# Patient Record
Sex: Male | Born: 1945 | Race: White | Hispanic: No | Marital: Single | Smoking: Never smoker
Health system: Southern US, Community
[De-identification: ages and names within clinical notes are randomized; demographics above are authoritative.]

## PROBLEM LIST (undated history)

## (undated) DIAGNOSIS — E119 Type 2 diabetes mellitus without complications: Secondary | ICD-10-CM

## (undated) DIAGNOSIS — E785 Hyperlipidemia, unspecified: Secondary | ICD-10-CM

## (undated) DIAGNOSIS — I10 Essential (primary) hypertension: Secondary | ICD-10-CM

## (undated) HISTORY — PX: BACK SURGERY: SHX140

---

## 2003-03-29 ENCOUNTER — Ambulatory Visit (HOSPITAL_COMMUNITY): Admission: RE | Admit: 2003-03-29 | Discharge: 2003-03-29 | Payer: Self-pay | Admitting: Gastroenterology

## 2010-01-09 ENCOUNTER — Encounter: Admission: RE | Admit: 2010-01-09 | Discharge: 2010-02-14 | Payer: Self-pay | Admitting: Orthopedic Surgery

## 2010-12-01 NOTE — Op Note (Signed)
   NAME:  Dakota Barnes, Dakota Barnes NO.:  [id number]   MEDICAL RECORD NO.:  [id number]                   PATIENT TYPE:  AMB   LOCATION:  ENDO                                 FACILITY:  [facility]   PHYSICIAN:  Anselmo Rod, M.D.               DATE OF BIRTH:  [birth year]   DATE OF PROCEDURE:  03/29/2003  DATE OF DISCHARGE:                                 OPERATIVE REPORT   PROCEDURE PERFORMED:  Colonoscopy.   ENDOSCOPIST:  Charna Elizabeth, M.D.   INSTRUMENT USED:  Olympus video colonoscope.   INDICATIONS FOR PROCEDURE:  Rectal bleeding in a 65 year old white male.  Rule out colonic polyps, masses, etc.   PREPROCEDURE PREPARATION:  Informed consent was procured from the patient.  The patient was fasted for eight hours prior to the procedure and prepped  with a bottle of magnesium citrate and a gallon of GoLYTELY the night prior  to the procedure.   PREPROCEDURE PHYSICAL:  The patient had stable vital signs.  Neck supple.  Chest clear to auscultation.  S1 and S2 regular.  Abdomen soft with normal  bowel sounds.   DESCRIPTION OF PROCEDURE:  The patient was placed in left lateral decubitus  position and sedated with 70 mg of Demerol and 7 mg of Versed intravenously.  Once the patient was adequately sedated and maintained on low flow oxygen  and continuous cardiac monitoring, the Olympus video colonoscope was  advanced from the rectum to the cecum.  The appendicular orifice and  ileocecal valve were clearly visualized and photographed.  Terminal ileum  appeared normal.  Prominent internal hemorrhoids were seen on retroflexion.  There was evidence of left-sided diverticulosis. There were a few  diverticula with inspissated stool in them.  No masses or polyps were seen.   IMPRESSION:  1. Sigmoid diverticulosis.  2. Prominent internal hemorrhoids.  3. Normal-appearing transverse colon, right colon, cecum and terminal ileum.   RECOMMENDATIONS:  1. A high fiber diet  with liberal fluid intake has been advocated.  2. Repeat colorectal cancer screening is recommended in the next 10 years     unless the patient develops any abnormal symptoms in the interim.  3. Outpatient follow-up as need arises in the future.                                                   Anselmo Rod, M.D.    JNM/MEDQ  D:  03/29/2003  T:  03/29/2003  Job:  [id number]   cc:   Gabriel Earing, M.D.  [address]  [place]  [place] [address]  Fax: [phone]

## 2012-11-10 ENCOUNTER — Encounter: Payer: Self-pay | Admitting: *Deleted

## 2012-11-10 ENCOUNTER — Encounter: Payer: Medicare Other | Attending: Family Medicine | Admitting: *Deleted

## 2012-11-10 DIAGNOSIS — R634 Abnormal weight loss: Secondary | ICD-10-CM | POA: Insufficient documentation

## 2012-11-10 DIAGNOSIS — Z713 Dietary counseling and surveillance: Secondary | ICD-10-CM | POA: Insufficient documentation

## 2012-11-10 DIAGNOSIS — E119 Type 2 diabetes mellitus without complications: Secondary | ICD-10-CM | POA: Insufficient documentation

## 2012-11-10 NOTE — Progress Notes (Signed)
  Medical Nutrition Therapy:  Appt start time: 1045 end time:  1145.  Assessment:  Primary concerns today: patient newly diagnosed with diabetes. Lives with wife and grown daughter. Both wife and pt shop for food and prepare meals eaten at home. He travels with work and has an apartment in [place] where he cooks, several days a week. Works as Production designer, theatre/television/film with [employer]. No SMBG yet. Activity level is limited, likes to walk or bike ride but had busy winter with work. He has started walking during his lunch for 3-45 minutes lately. States weight loss of 6 pounds in past month.  MEDICATIONS: see list, Diabetes medication in Metformin   DIETARY INTAKE: Usual eating pattern includes 2-3 meals and 2-3 snacks per day.  Everyday foods include good variety of all food groups.  Avoided foods include chips, sweets usually, soft drinks, .    24-hr recall:  B ( AM): cereal with 1-2% milk OR [employer] muffin with sausage and egg, black coffee Snk ( AM): small oranges during the day  L ( PM): skips occasionally, sandwich with fresh fruit,  Snk ( PM): if anything, fresh fruit OR popcorn D ( PM): grilled meat with salad OR tenderloin with vegetable and or salad, OR sandwich, milk Snk ( PM): fresh fruit OR Activia yogurt Beverages: coffee, milk, water or occasionally 1/2 of a Diet Coke  Usual physical activity: walking during lunch break at work now. Active on weekends with horses and yard work.  Estimated energy needs: 1600 calories 180 g carbohydrates 120 g protein 44 g fat  Progress Towards Goal(s):  In progress.   Nutritional Diagnosis:  NB-1.1 Food and nutrition-related knowledge deficit As related to new diagnosis of diabetes.  As evidenced by A1c of 7.3% on 10/10/2012.    Intervention:  Nutrition counseling and diabetes education initiated. Discussed basic physiology of diabetes, SMBG and rationale of checking BG at alternate times of day, A1c, Carb Counting and reading food  labels, and benefits of increased activity. He feels he would benefit from obtaining a meter so he can be more aware of his diabetes management. . Plan:  Aim for 3 Carb Choices per meal (45 grams) +/- 1 either way  Aim for 0-2 Carbs per snack if hungry  Consider reading food labels for Total Carbohydrate of foods Continue with your activity level of walking and other activities daily as tolerated Consider checking BG at alternate times per day as directed by MD   Handouts given during visit include: Living Well with Diabetes Carb Counting and Food Label handouts Meal Plan Card  Monitoring/Evaluation:  Dietary intake, exercise, reading food labels, and body weight prn.

## 2012-11-10 NOTE — Patient Instructions (Signed)
Plan:  Aim for 3 Carb Choices per meal (45 grams) +/- 1 either way  Aim for 0-2 Carbs per snack if hungry  Consider reading food labels for Total Carbohydrate of foods Continue with your activity level of walking and other activities daily as tolerated Consider checking BG at alternate times per day as directed by MD

## 2017-09-09 ENCOUNTER — Ambulatory Visit (INDEPENDENT_AMBULATORY_CARE_PROVIDER_SITE_OTHER): Payer: Self-pay | Admitting: Orthopaedic Surgery

## 2017-09-23 ENCOUNTER — Encounter (INDEPENDENT_AMBULATORY_CARE_PROVIDER_SITE_OTHER): Payer: Self-pay | Admitting: Orthopaedic Surgery

## 2017-09-23 ENCOUNTER — Ambulatory Visit (INDEPENDENT_AMBULATORY_CARE_PROVIDER_SITE_OTHER): Payer: Medicare HMO

## 2017-09-23 ENCOUNTER — Ambulatory Visit (INDEPENDENT_AMBULATORY_CARE_PROVIDER_SITE_OTHER): Payer: Medicare HMO | Admitting: Orthopaedic Surgery

## 2017-09-23 DIAGNOSIS — M1711 Unilateral primary osteoarthritis, right knee: Secondary | ICD-10-CM

## 2017-09-23 DIAGNOSIS — G8929 Other chronic pain: Secondary | ICD-10-CM

## 2017-09-23 DIAGNOSIS — M25561 Pain in right knee: Secondary | ICD-10-CM | POA: Diagnosis not present

## 2017-09-23 MED ORDER — LIDOCAINE HCL 1 % IJ SOLN
3.0000 mL | INTRAMUSCULAR | Status: AC | PRN
  Administered 2017-09-23: 3 mL

## 2017-09-23 MED ORDER — METHYLPREDNISOLONE ACETATE 40 MG/ML IJ SUSP
40.0000 mg | INTRAMUSCULAR | Status: AC | PRN
  Administered 2017-09-23: 40 mg via INTRA_ARTICULAR

## 2017-09-23 NOTE — Progress Notes (Signed)
Office Visit Note   Patient: Dakota Barnes           Date of Birth: Nov 14, 1945           MRN: [id number] Visit Date: 09/23/2017              Requested by: Angelica Chessman, MD [address] [place], [place] [address] PCP: Angelica Chessman, MD   Assessment & Plan: Visit Diagnoses:  1. Chronic pain of right knee   2. Unilateral primary osteoarthritis, right knee     Plan: I was able to aspirate 40 cc of fluid from his knee consistent with arthritic type of fluid.  I placed a steroid injection in his knee and he tolerated this well.  I do feel this is the best course of treatment for him as well as quad strengthening exercises.  I will do feel that he is a candidate for hyaluronic acid given the moderate arthritis we are seeing.  He is agreeable to this as well.  We will see him back in 4 weeks to place hyaluronic acid into his right knee.  All questions concerns were answered and addressed.  Follow-Up Instructions: Return in about 4 weeks (around 10/21/2017).   Orders:  Orders Placed This Encounter  Procedures  . Large Joint Inj  . XR Knee 1-2 Views Right   No orders of the defined types were placed in this encounter.     Procedures: Large Joint Inj: R knee on 09/23/2017 10:20 AM Indications: diagnostic evaluation and pain Details: 22 G 1.5 in needle, superolateral approach  Arthrogram: No  Medications: 3 mL lidocaine 1 %; 40 mg methylPREDNISolone acetate 40 MG/ML Outcome: tolerated well, no immediate complications Procedure, treatment alternatives, risks and benefits explained, specific risks discussed. Consent was given by the patient. Immediately prior to procedure a time out was called to verify the correct patient, procedure, equipment, support staff and site/side marked as required. Patient was prepped and draped in the usual sterile fashion.       Clinical Data: No additional findings.   Subjective: Chief Complaint  Patient presents with  .  Right Knee - Pain  Patient is a very pleasant 72 year old I am seeing for the first time as a patient I have taken care of his wife before.  He comes in with chief complaint of right knee pain is been slowly getting worse with time.  It does wake him up at night on occasion.  Sometimes it does act as if his knee is going to give out on him.  He denies any specific injury to that knee.  He denies any locking catching.  HPI  Review of Systems He currently denies any headache, chest pain, shortness of breath, fever, chills, nausea, vomiting.  Objective: Vital Signs: There were no vitals taken for this visit.  Physical Exam He he is alert and oriented x3 and in no acute distress Ortho Exam Examination of the right knee does show mild to moderate effusion.  Is full range of motion of the knee with some slight patellofemoral crepitation.  He is a little bit of medial lateral joint line tenderness.  The knee feels ligamentously stable. Specialty Comments:  No specialty comments available.  Imaging: Xr Knee 1-2 Views Right  Result Date: 09/23/2017 2 views of the right knee show moderate arthritic changes with slight joint space narrowing and tricompartmental periarticular osteophytes in all 3 compartments.    PMFS History: Patient Active Problem List  Diagnosis Date Noted  . Unilateral primary osteoarthritis, right knee 09/23/2017  . Chronic pain of right knee 09/23/2017   History reviewed. No pertinent past medical history.  History reviewed. No pertinent family history.  History reviewed. No pertinent surgical history. Social History   Occupational History  . Not on file  Tobacco Use  . Smoking status: Never Smoker  . Smokeless tobacco: Never Used  Substance and Sexual Activity  . Alcohol use: Yes    Comment: beer 1-2 on weekends and after work  . Drug use: Not on file  . Sexual activity: Not on file

## 2017-09-24 ENCOUNTER — Telehealth (INDEPENDENT_AMBULATORY_CARE_PROVIDER_SITE_OTHER): Payer: Self-pay

## 2017-09-24 NOTE — Telephone Encounter (Signed)
Submitted application online for Monovisc, right knee. 

## 2017-10-11 ENCOUNTER — Telehealth (INDEPENDENT_AMBULATORY_CARE_PROVIDER_SITE_OTHER): Payer: Self-pay

## 2017-10-11 NOTE — Telephone Encounter (Signed)
Initiated PA for Monovisc, right knee.  Will receive a fax from [employer]. Reference# [id number]

## 2017-10-17 ENCOUNTER — Telehealth (INDEPENDENT_AMBULATORY_CARE_PROVIDER_SITE_OTHER): Payer: Self-pay

## 2017-10-17 NOTE — Telephone Encounter (Signed)
Faxed form and additional information to [employer] at [phone].

## 2017-10-18 ENCOUNTER — Telehealth (INDEPENDENT_AMBULATORY_CARE_PROVIDER_SITE_OTHER): Payer: Self-pay

## 2017-10-18 NOTE — Telephone Encounter (Signed)
Faxed completed authorization form to [employer] at [phone].

## 2017-10-28 ENCOUNTER — Encounter (INDEPENDENT_AMBULATORY_CARE_PROVIDER_SITE_OTHER): Payer: Self-pay | Admitting: Orthopaedic Surgery

## 2017-10-28 ENCOUNTER — Ambulatory Visit (INDEPENDENT_AMBULATORY_CARE_PROVIDER_SITE_OTHER): Payer: Medicare HMO | Admitting: Orthopaedic Surgery

## 2017-10-28 DIAGNOSIS — M1711 Unilateral primary osteoarthritis, right knee: Secondary | ICD-10-CM

## 2017-10-28 MED ORDER — HYALURONAN 88 MG/4ML IX SOSY
88.0000 mg | PREFILLED_SYRINGE | INTRA_ARTICULAR | Status: AC | PRN
  Administered 2017-10-28: 88 mg via INTRA_ARTICULAR

## 2017-10-28 NOTE — Progress Notes (Signed)
   Procedure Note  Patient: Dakota Barnes             Date of Birth: 01/02/1946           MRN: [id number]             Visit Date: 10/28/2017  Procedures: Visit Diagnoses: Unilateral primary osteoarthritis, right knee  Large Joint Inj: R knee on 10/28/2017 9:41 AM Indications: pain and diagnostic evaluation Details: 22 G 1.5 in needle, superolateral approach  Arthrogram: No  Medications: 88 mg Hyaluronan 88 MG/4ML Outcome: tolerated well, no immediate complications Procedure, treatment alternatives, risks and benefits explained, specific risks discussed. Consent was given by the patient. Immediately prior to procedure a time out was called to verify the correct patient, procedure, equipment, support staff and site/side marked as required. Patient was prepped and draped in the usual sterile fashion.     The patient is here for scheduled hyaluronic acid injection in his right knee today with Monovisc.  This is to treat moderate osteoarthritis pain of his right knee.  We have aspirated his knee before and place a steroid and that helped temporize things with his knee.  He said no other acute changes in his medical status.  On exam of his right knee today there is no effusion.  He does have some medial lateral joint line tenderness and patellofemoral crepitation but it is all manageable.  I provided Monovisc injection in his right knee without difficulty.  All questions concerns were answered and addressed.  He will follow-up as needed.

## 2018-06-23 ENCOUNTER — Emergency Department (HOSPITAL_BASED_OUTPATIENT_CLINIC_OR_DEPARTMENT_OTHER)
Admission: EM | Admit: 2018-06-23 | Discharge: 2018-06-23 | Disposition: A | Discharge status: Home / Self Care | Payer: Medicare HMO | Attending: Emergency Medicine | Admitting: Emergency Medicine

## 2018-06-23 ENCOUNTER — Encounter (HOSPITAL_BASED_OUTPATIENT_CLINIC_OR_DEPARTMENT_OTHER): Payer: Self-pay | Admitting: *Deleted

## 2018-06-23 ENCOUNTER — Other Ambulatory Visit: Payer: Self-pay

## 2018-06-23 ENCOUNTER — Emergency Department (HOSPITAL_BASED_OUTPATIENT_CLINIC_OR_DEPARTMENT_OTHER): Payer: Medicare HMO

## 2018-06-23 DIAGNOSIS — Z79899 Other long term (current) drug therapy: Secondary | ICD-10-CM | POA: Insufficient documentation

## 2018-06-23 DIAGNOSIS — E119 Type 2 diabetes mellitus without complications: Secondary | ICD-10-CM | POA: Diagnosis not present

## 2018-06-23 DIAGNOSIS — I10 Essential (primary) hypertension: Secondary | ICD-10-CM | POA: Diagnosis not present

## 2018-06-23 DIAGNOSIS — Z7984 Long term (current) use of oral hypoglycemic drugs: Secondary | ICD-10-CM | POA: Diagnosis not present

## 2018-06-23 DIAGNOSIS — N200 Calculus of kidney: Secondary | ICD-10-CM | POA: Insufficient documentation

## 2018-06-23 DIAGNOSIS — R1032 Left lower quadrant pain: Secondary | ICD-10-CM | POA: Diagnosis present

## 2018-06-23 HISTORY — DX: Essential (primary) hypertension: I10

## 2018-06-23 HISTORY — DX: Type 2 diabetes mellitus without complications: E11.9

## 2018-06-23 HISTORY — DX: Hyperlipidemia, unspecified: E78.5

## 2018-06-23 LAB — CBC WITH DIFFERENTIAL/PLATELET
Abs Immature Granulocytes: 0.06 10*3/uL (ref 0.00–0.07)
Basophils Absolute: 0 10*3/uL (ref 0.0–0.1)
Basophils Relative: 0 %
EOS PCT: 0 %
Eosinophils Absolute: 0 10*3/uL (ref 0.0–0.5)
HEMATOCRIT: 44.8 % (ref 39.0–52.0)
Hemoglobin: 14.3 g/dL (ref 13.0–17.0)
Immature Granulocytes: 0 %
Lymphocytes Relative: 9 %
Lymphs Abs: 1.3 10*3/uL (ref 0.7–4.0)
MCH: 29.5 pg (ref 26.0–34.0)
MCHC: 31.9 g/dL (ref 30.0–36.0)
MCV: 92.4 fL (ref 80.0–100.0)
MONO ABS: 0.7 10*3/uL (ref 0.1–1.0)
Monocytes Relative: 5 %
Neutro Abs: 11.5 10*3/uL — ABNORMAL HIGH (ref 1.7–7.7)
Neutrophils Relative %: 86 %
Platelets: 277 10*3/uL (ref 150–400)
RBC: 4.85 MIL/uL (ref 4.22–5.81)
RDW: 12.8 % (ref 11.5–15.5)
WBC: 13.6 10*3/uL — ABNORMAL HIGH (ref 4.0–10.5)
nRBC: 0 % (ref 0.0–0.2)

## 2018-06-23 LAB — COMPREHENSIVE METABOLIC PANEL
ALT: 22 U/L (ref 0–44)
AST: 24 U/L (ref 15–41)
Albumin: 4.4 g/dL (ref 3.5–5.0)
Alkaline Phosphatase: 47 U/L (ref 38–126)
Anion gap: 8 (ref 5–15)
BUN: 20 mg/dL (ref 8–23)
CO2: 25 mmol/L (ref 22–32)
Calcium: 9.6 mg/dL (ref 8.9–10.3)
Chloride: 98 mmol/L (ref 98–111)
Creatinine, Ser: 1.24 mg/dL (ref 0.61–1.24)
GFR calc non Af Amer: 58 mL/min — ABNORMAL LOW (ref >60)
Glucose, Bld: 161 mg/dL — ABNORMAL HIGH (ref 70–99)
Potassium: 4.5 mmol/L (ref 3.5–5.1)
Sodium: 131 mmol/L — ABNORMAL LOW (ref 135–145)
Total Bilirubin: 0.7 mg/dL (ref 0.3–1.2)
Total Protein: 7.2 g/dL (ref 6.5–8.1)

## 2018-06-23 LAB — URINALYSIS, MICROSCOPIC (REFLEX)

## 2018-06-23 LAB — URINALYSIS, ROUTINE W REFLEX MICROSCOPIC
BILIRUBIN URINE: NEGATIVE
Glucose, UA: NEGATIVE mg/dL
Ketones, ur: NEGATIVE mg/dL
Leukocytes, UA: NEGATIVE
Nitrite: NEGATIVE
Protein, ur: NEGATIVE mg/dL
Specific Gravity, Urine: 1.025 (ref 1.005–1.030)
pH: 6 (ref 5.0–8.0)

## 2018-06-23 MED ORDER — HYDROMORPHONE HCL 1 MG/ML IJ SOLN
1.0000 mg | Freq: Once | INTRAMUSCULAR | Status: AC
  Administered 2018-06-23: 1 mg via INTRAVENOUS
  Filled 2018-06-23: qty 1

## 2018-06-23 MED ORDER — TAMSULOSIN HCL 0.4 MG PO CAPS: 0.4000 mg | ORAL_CAPSULE | Freq: Every day | ORAL | 0 refills | Status: AC

## 2018-06-23 MED ORDER — OXYCODONE-ACETAMINOPHEN 5-325 MG PO TABS
1.0000 | ORAL_TABLET | Freq: Once | ORAL | Status: AC
  Administered 2018-06-23: 1 via ORAL
  Filled 2018-06-23: qty 1

## 2018-06-23 MED ORDER — ONDANSETRON HCL 4 MG/2ML IJ SOLN
4.0000 mg | Freq: Once | INTRAMUSCULAR | Status: AC
  Administered 2018-06-23: 4 mg via INTRAVENOUS
  Filled 2018-06-23: qty 2

## 2018-06-23 MED ORDER — OXYCODONE-ACETAMINOPHEN 5-325 MG PO TABS: 1.0000 | ORAL_TABLET | Freq: Four times a day (QID) | ORAL | 0 refills | Status: DC | PRN

## 2018-06-23 MED ORDER — TAMSULOSIN HCL 0.4 MG PO CAPS: 0.4000 mg | ORAL_CAPSULE | Freq: Every day | ORAL | 0 refills | Status: DC

## 2018-06-23 NOTE — ED Notes (Signed)
Patient transported to CT 

## 2018-06-23 NOTE — ED Provider Notes (Signed)
[facility] EMERGENCY DEPARTMENT Provider Note   CSN: [id number] Arrival date & time: 06/23/18  1307     History   Chief Complaint Chief Complaint  Patient presents with  . Abdominal Pain    HPI Dakota Barnes is a 72 y.o. male.  The history is provided by the patient.  Abdominal Pain   This is a new problem. The current episode started yesterday. The problem occurs daily. The problem has been rapidly worsening. The pain is associated with an unknown factor. The pain is located in the LLQ (left  flank). The quality of the pain is aching, colicky, shooting, throbbing and sharp. The pain is at a severity of 9/10. The pain is severe. Associated symptoms include nausea. Pertinent negatives include anorexia, fever, diarrhea, flatus, vomiting, dysuria and frequency. Nothing aggravates the symptoms. Nothing relieves the symptoms. Past medical history comments: hx of DM, HTN.    Past Medical History:  Diagnosis Date  . Diabetes mellitus without complication (HCC)   . Hyperlipidemia   . Hypertension     Patient Active Problem List   Diagnosis Date Noted  . Unilateral primary osteoarthritis, right knee 09/23/2017  . Chronic pain of right knee 09/23/2017    Past Surgical History:  Procedure Laterality Date  . BACK SURGERY          Home Medications    Prior to Admission medications   Medication Sig Start Date End Date Taking? Authorizing Provider  aspirin EC 81 MG tablet Take by mouth. 03/22/15  Yes [provider]  fenofibrate 54 MG tablet Take by mouth. 10/02/16  Yes [provider]  gabapentin (NEURONTIN) 300 MG capsule Take 1 capsule twice daily. 02/11/17  Yes [provider]  losartan (COZAAR) 100 MG tablet Take by mouth. 08/20/16  Yes [provider]  meloxicam (MOBIC) 7.5 MG tablet TAKE 1 TABLET BY MOUTH EVERY DAY WITH FOOD FOR KNEE/HAND PAIN 09/12/16  Yes [provider]  montelukast (SINGULAIR) 10 MG tablet Take by  mouth. 04/09/17  Yes [provider]  simvastatin (ZOCOR) 10 MG tablet TAKE 1 TABLET BY MOUTH ONCE DAILY 10/02/16  Yes [provider]  metFORMIN (GLUCOPHAGE) 500 MG tablet Take 500 mg by mouth 2 (two) times daily with a meal.    [provider]  Multiple Vitamin (MULTI-VITAMINS) TABS Take by mouth.    [provider]  Omega-3 1000 MG CAPS Take by mouth.    [provider]    Family History History reviewed. No pertinent family history.  Social History Social History   Tobacco Use  . Smoking status: Never Smoker  . Smokeless tobacco: Never Used  Substance Use Topics  . Alcohol use: Yes    Comment: beer 1-2 on weekends and after work  . Drug use: Not Currently     Allergies   Patient has no known allergies.   Review of Systems Review of Systems  Constitutional: Negative for fever.  Gastrointestinal: Positive for abdominal pain and nausea. Negative for anorexia, diarrhea, flatus and vomiting.  Genitourinary: Negative for dysuria and frequency.  All other systems reviewed and are negative.    Physical Exam Updated Vital Signs BP (!) 178/86 (BP Location: Right Arm)   Pulse (!) 58   Temp 97.8 F (36.6 C) (Oral)   Resp 18   Ht 5\' 8"  (1.727 m)   Wt 98 kg   SpO2 100%   BMI 32.84 kg/m   Physical Exam  Constitutional: He is oriented to person, place,  and time. He appears well-developed and well-nourished. No distress.  Appears uncomfortable  HENT:  Head: Normocephalic and atraumatic.  Mouth/Throat: Oropharynx is clear and moist.  Eyes: Pupils are equal, round, and reactive to light. Conjunctivae and EOM are normal.  Neck: Normal range of motion. Neck supple.  Cardiovascular: Normal rate, regular rhythm and intact distal pulses.  No murmur heard. Pulmonary/Chest: Effort normal and breath sounds normal. No respiratory distress. He has no wheezes. He has no rales.  Abdominal: Soft. He exhibits no distension. There is tenderness  in the left lower quadrant. There is CVA tenderness. There is no rebound and no guarding.  Musculoskeletal: Normal range of motion. He exhibits no edema or tenderness.  Neurological: He is alert and oriented to person, place, and time.  Skin: Skin is warm and dry. No rash noted. No erythema.  Psychiatric: He has a normal mood and affect. His behavior is normal.  Nursing note and vitals reviewed.    ED Treatments / Results  Labs (all labs ordered are listed, but only abnormal results are displayed) Labs Reviewed  URINALYSIS, ROUTINE W REFLEX MICROSCOPIC - Abnormal; Notable for the following components:      Result Value   Hgb urine dipstick SMALL (*)    All other components within normal limits  CBC WITH DIFFERENTIAL/PLATELET - Abnormal; Notable for the following components:   WBC 13.6 (*)    Neutro Abs 11.5 (*)    All other components within normal limits  COMPREHENSIVE METABOLIC PANEL - Abnormal; Notable for the following components:   Sodium 131 (*)    Glucose, Bld 161 (*)    GFR calc non Af Amer 58 (*)    All other components within normal limits  URINALYSIS, MICROSCOPIC (REFLEX) - Abnormal; Notable for the following components:   Bacteria, UA RARE (*)    All other components within normal limits    EKG None  Radiology Ct Renal Stone Study  Result Date: 06/23/2018 CLINICAL DATA:  Left flank pain and nausea for 2 days EXAM: CT ABDOMEN AND PELVIS WITHOUT CONTRAST TECHNIQUE: Multidetector CT imaging of the abdomen and pelvis was performed following the standard protocol without IV contrast. COMPARISON:  None. FINDINGS: Lower chest: Lung bases are free of acute infiltrate or sizable effusion. Calcified hilar and mediastinal lymph nodes are noted consistent with prior granulomatous disease. Hepatobiliary: No focal liver abnormality is seen. No gallstones, gallbladder wall thickening, or biliary dilatation. Pancreas: Unremarkable. No pancreatic ductal dilatation or surrounding  inflammatory changes. Spleen: Normal in size without focal abnormality. Adrenals/Urinary Tract: Adrenal glands are within normal limits. The right kidney demonstrates no renal calculi or obstructive changes. A small 1 cm cyst is noted in the mid to lower pole. The right ureter is unremarkable. The bladder is well distended. The left kidney demonstrates hydronephrotic change extending into the proximal left ureter secondary to a 9 mm obstructing stone. Perinephric stranding is seen. The more distal left ureter is within normal limits. Scattered cysts are noted within the left kidney is well. The largest of these measures 1.5 cm. Stomach/Bowel: Diverticulosis without evidence of diverticulitis. This is primarily within the sigmoid colon. The appendix is within normal limits. No small bowel or gastric abnormality is seen. Vascular/Lymphatic: Aortic atherosclerosis. No enlarged abdominal or pelvic lymph nodes. Reproductive: Prostate is unremarkable. Other: No abdominal wall hernia or abnormality. No abdominopelvic ascites. Musculoskeletal: Degenerative changes of the lumbar spine are seen. IMPRESSION: 9 mm stone within the proximal left ureter causing obstructive change in perinephric stranding. Diverticulosis  without diverticulitis. Changes of prior granulomatous disease. Bilateral renal cysts. Electronically Signed   By: Alcide Clever M.D.   On: 06/23/2018 17:01    Procedures Procedures (including critical care time)  Medications Ordered in ED Medications  HYDROmorphone (DILAUDID) injection 1 mg (has no administration in time range)  ondansetron (ZOFRAN) injection 4 mg (has no administration in time range)     Initial Impression / Assessment and Plan / ED Course  I have reviewed the triage vital signs and the nursing notes.  Pertinent labs & imaging results that were available during my care of the patient were reviewed by me and considered in my medical decision making (see chart for details).      Pt with symptoms consistent with kidney stone.  Denies infectious sx, or GI symptoms.  Low concern for diverticulitis or history suggestive of AAA.  No hx suggestive of GU source (discharge).  Will treat pain and ensure no infection with UA, CBC, CMP and will get stone study to further eval.  4:51 PM UA with small Hb and remainder of labs wnl.  CT pending.  6:21 PM CT is consistent with a 9 mm stone and hydronephrosis.  After the first dose of pain control with Dilaudid patient had has pain of 2 out of 10.  He was given oral Percocet and the pain has remained controlled.  He was discharged home with Percocet and Flomax.  Given urology follow-up and strict return precautions.   Final Clinical Impressions(s) / ED Diagnoses   Final diagnoses:  Kidney stone on left side    ED Discharge Orders         Ordered    oxyCODONE-acetaminophen (PERCOCET/ROXICET) 5-325 MG tablet  Every 6 hours PRN     06/23/18 1819    tamsulosin (FLOMAX) 0.4 MG CAPS capsule  Daily after supper     06/23/18 1819           Gwyneth Sprout, MD 06/23/18 Rickey Primus

## 2018-06-23 NOTE — ED Notes (Signed)
Pt verbalizes understanding of d/c instructions and denies any further needs at this time. 

## 2018-06-23 NOTE — ED Triage Notes (Signed)
Pt c/o left lower abd pain lower back pain x 2 days

## 2018-06-23 NOTE — ED Notes (Signed)
ED Provider at bedside. 

## 2018-06-23 NOTE — Discharge Instructions (Signed)
Return if you have severe pain that is not controlled with medications, recurrent vomiting or fever.

## 2019-11-24 IMAGING — CT CT RENAL STONE PROTOCOL
2 of 4 series · 16 of 46 positions shown, 18 images · non-contrast
Comparison: None.

CLINICAL DATA: Left flank pain and nausea for 2 days

EXAM:
CT ABDOMEN AND PELVIS WITHOUT CONTRAST
TECHNIQUE: Multidetector CT imaging of the abdomen and pelvis was performed
following the standard protocol without IV contrast.

[Series 2: axial st · axial · 0.90mm/px · z∈[-465,-10]mm · 13 of 101 slices shown, 15 images]
[im 5/101  soft-tissue]
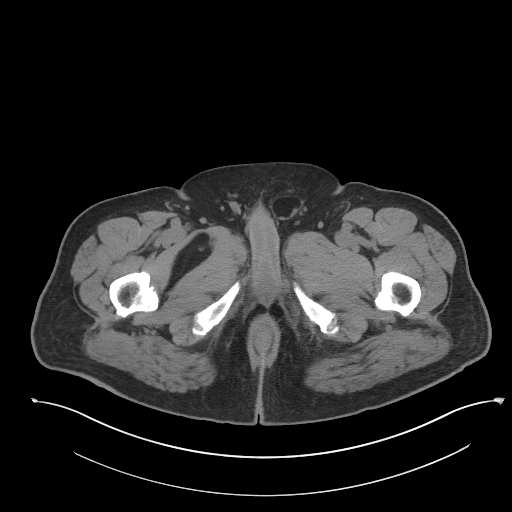
[im 5/101  bone]
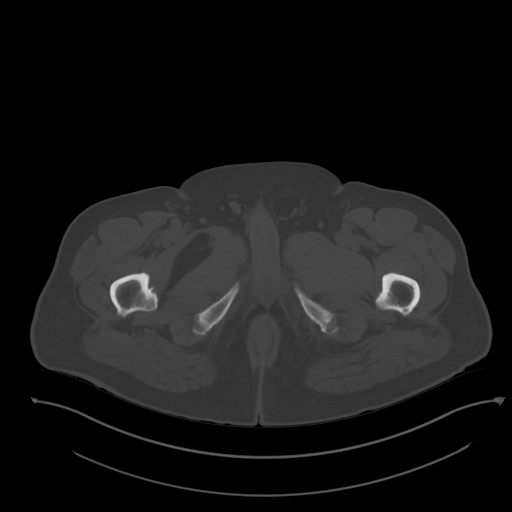
[im 14/101  soft-tissue]
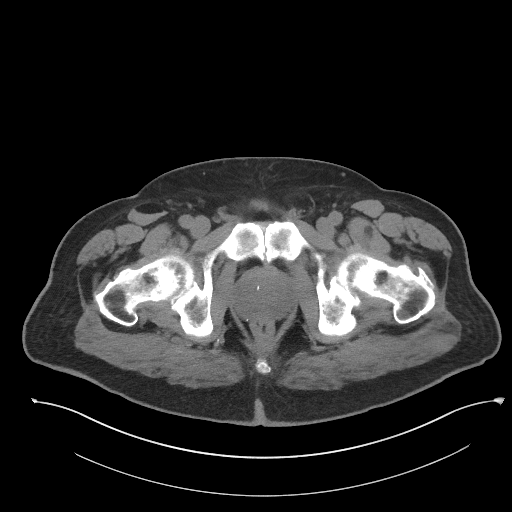
[im 22/101  soft-tissue]
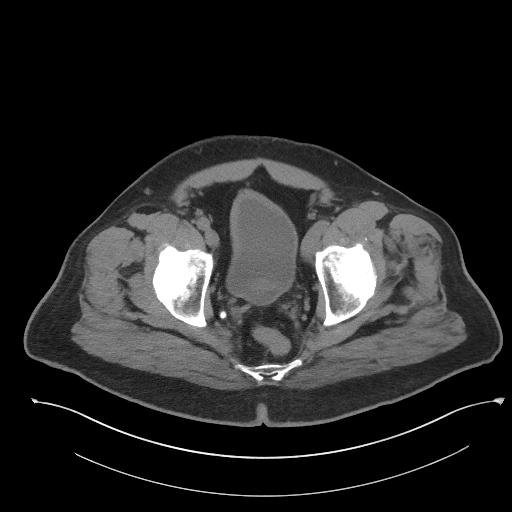
[im 27/101  soft-tissue]
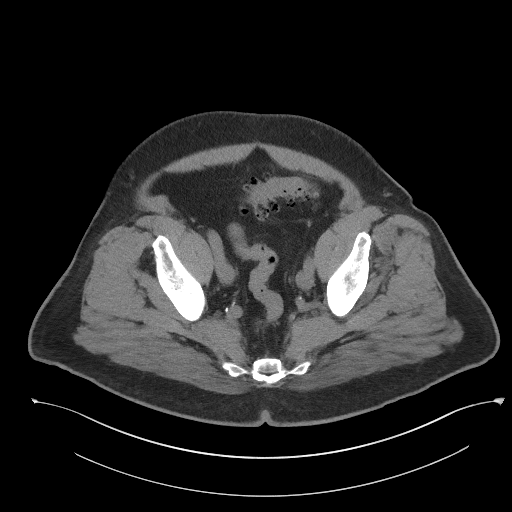
[im 35/101  soft-tissue]
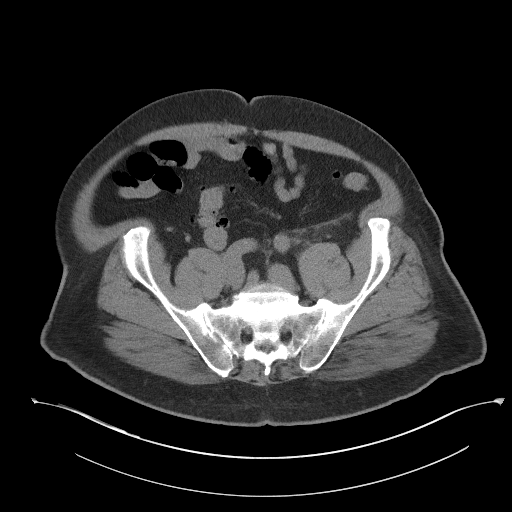
[im 44/101  soft-tissue]
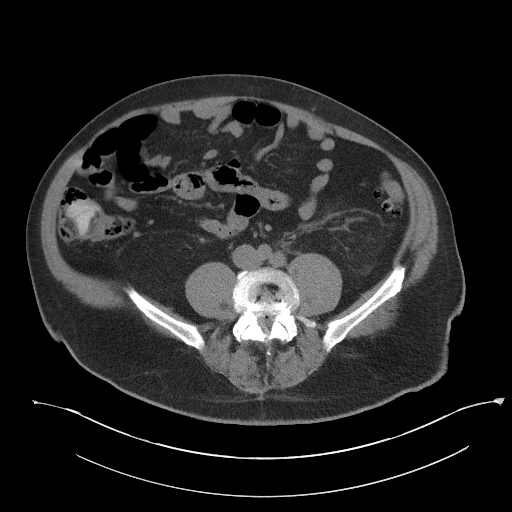
[im 53/101  soft-tissue]
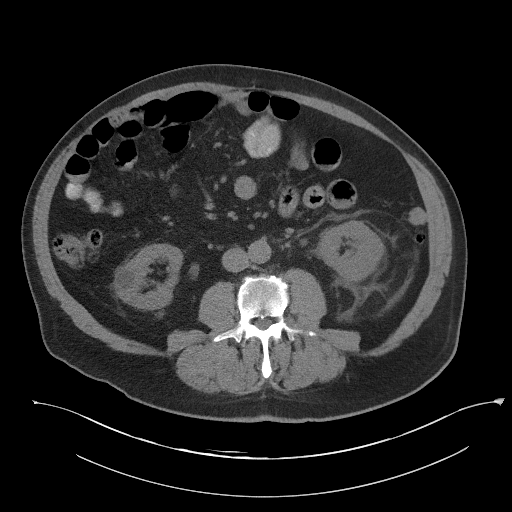
[im 57/101  soft-tissue]
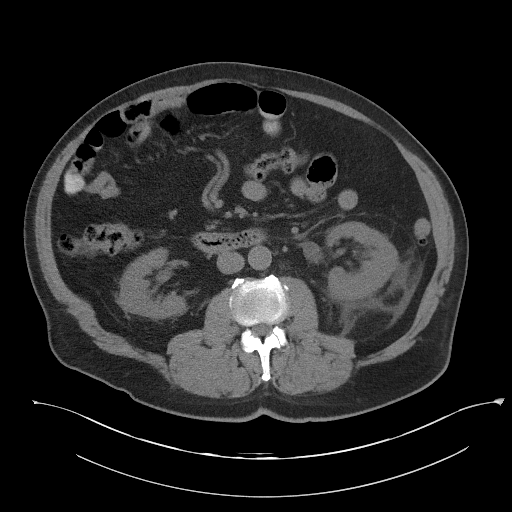
[im 66/101  soft-tissue]
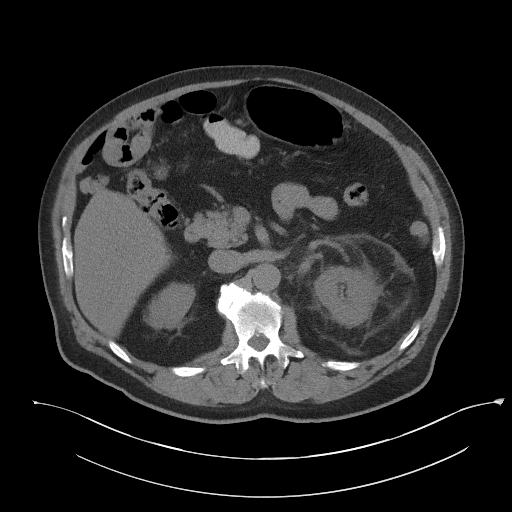
[im 66/101  bone]
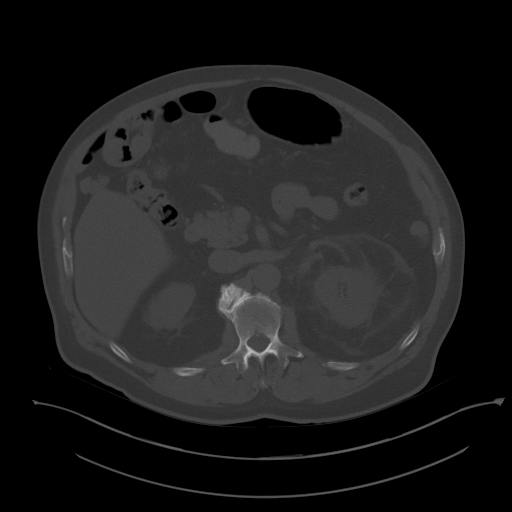
[im 74/101  soft-tissue]
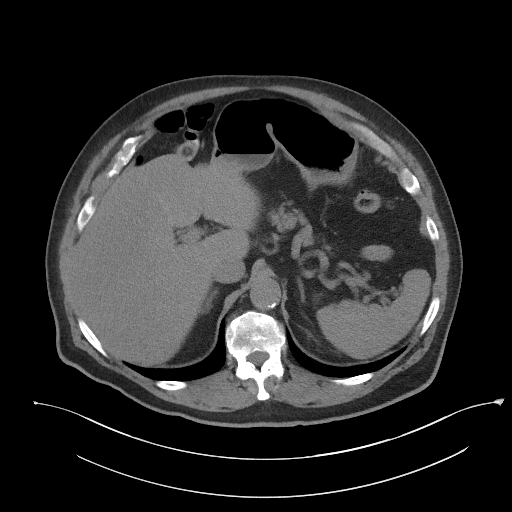
[im 79/101  soft-tissue]
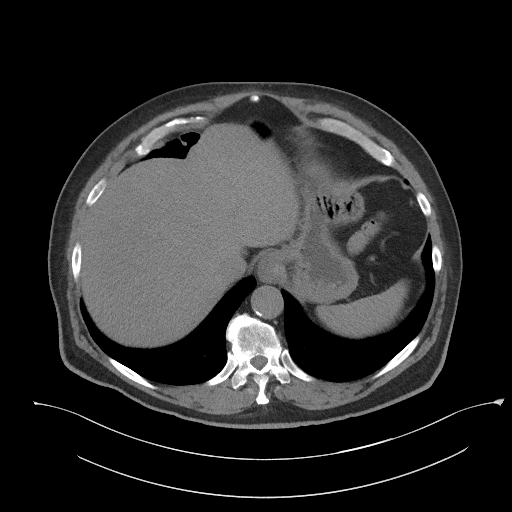
[im 87/101  soft-tissue]
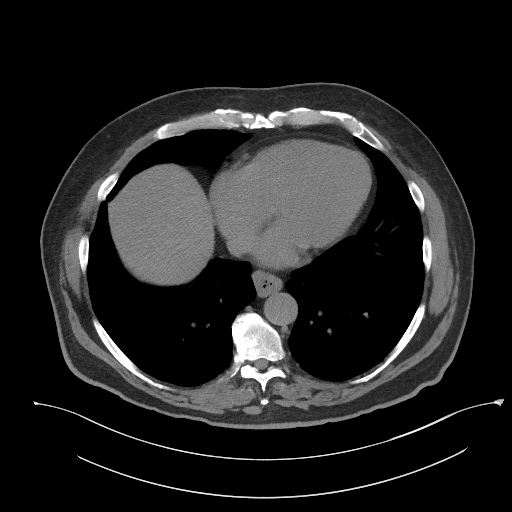
[im 96/101  soft-tissue]
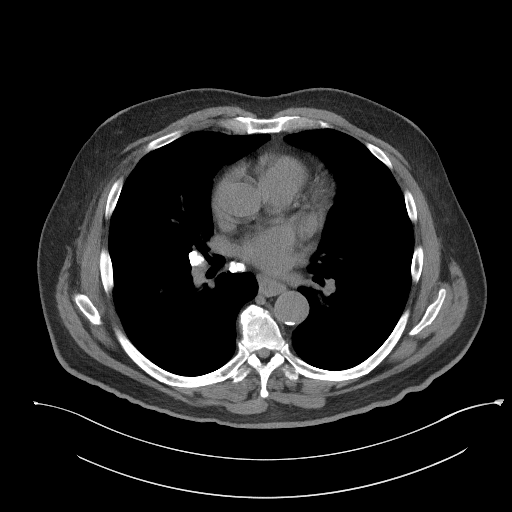

[Series 5: coronal st · coronal · 0.85mm/px · 3 of 101 slices shown]
[im 34/101  soft-tissue]
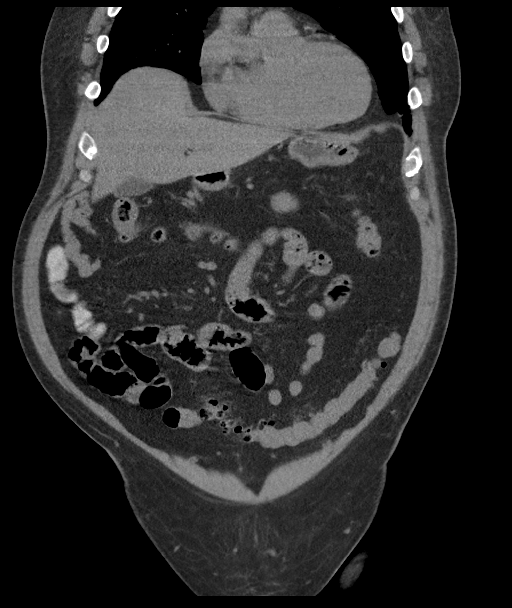
[im 45/101  soft-tissue]
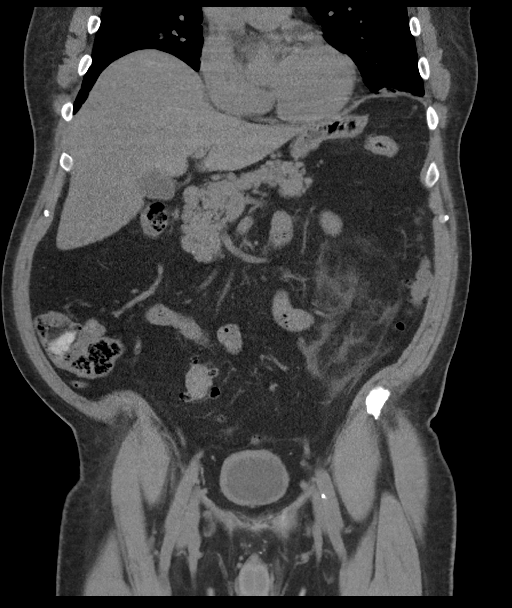
[im 56/101  soft-tissue]
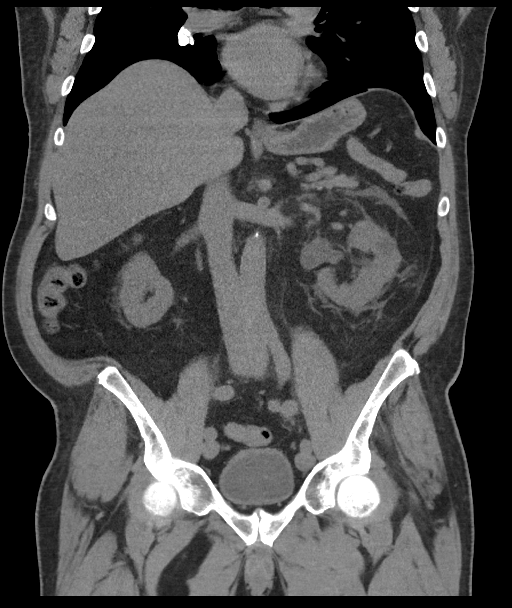

[16 of 46 positions shown; findings below may reference images not displayed]

FINDINGS: Lower chest: Lung bases are free of acute infiltrate or sizable
effusion. Calcified hilar and mediastinal lymph nodes are noted
consistent with prior granulomatous disease.

Hepatobiliary: No focal liver abnormality is seen. No gallstones,
gallbladder wall thickening, or biliary dilatation.

Pancreas: Unremarkable. No pancreatic ductal dilatation or
surrounding inflammatory changes.

Spleen: Normal in size without focal abnormality.

Adrenals/Urinary Tract: Adrenal glands are within normal limits. The
right kidney demonstrates no renal calculi or obstructive changes. A
small 1 cm cyst is noted in the mid to lower pole. The right ureter
is unremarkable. The bladder is well distended. The left kidney
demonstrates hydronephrotic change extending into the proximal left
ureter secondary to a 9 mm obstructing stone. Perinephric stranding
is seen. The more distal left ureter is within normal limits.
Scattered cysts are noted within the left kidney is well. The
largest of these measures 1.5 cm.

Stomach/Bowel: Diverticulosis without evidence of diverticulitis.
This is primarily within the sigmoid colon. The appendix is within
normal limits. No small bowel or gastric abnormality is seen.

Vascular/Lymphatic: Aortic atherosclerosis. No enlarged abdominal or
pelvic lymph nodes.

Reproductive: Prostate is unremarkable.

Other: No abdominal wall hernia or abnormality. No abdominopelvic
ascites.

Musculoskeletal: Degenerative changes of the lumbar spine are seen.
IMPRESSION: 9 mm stone within the proximal left ureter causing obstructive
change in perinephric stranding.

Diverticulosis without diverticulitis.

Changes of prior granulomatous disease.

Bilateral renal cysts.

## 2021-06-14 ENCOUNTER — Encounter: Payer: Self-pay | Admitting: Physician Assistant

## 2021-06-14 ENCOUNTER — Ambulatory Visit (INDEPENDENT_AMBULATORY_CARE_PROVIDER_SITE_OTHER): Payer: Medicare HMO | Admitting: Physician Assistant

## 2021-06-14 ENCOUNTER — Ambulatory Visit (INDEPENDENT_AMBULATORY_CARE_PROVIDER_SITE_OTHER): Payer: Medicare HMO

## 2021-06-14 ENCOUNTER — Other Ambulatory Visit: Payer: Self-pay

## 2021-06-14 DIAGNOSIS — M25561 Pain in right knee: Secondary | ICD-10-CM

## 2021-06-14 DIAGNOSIS — G8929 Other chronic pain: Secondary | ICD-10-CM

## 2021-06-14 DIAGNOSIS — M25511 Pain in right shoulder: Secondary | ICD-10-CM | POA: Diagnosis not present

## 2021-06-14 DIAGNOSIS — R200 Anesthesia of skin: Secondary | ICD-10-CM

## 2021-06-14 MED ORDER — METHYLPREDNISOLONE ACETATE 40 MG/ML IJ SUSP
40.0000 mg | INTRAMUSCULAR | Status: AC | PRN
Start: 2021-06-14 — End: 2021-06-14
  Administered 2021-06-14: 40 mg via INTRA_ARTICULAR

## 2021-06-14 MED ORDER — LIDOCAINE HCL 1 % IJ SOLN
3.0000 mL | INTRAMUSCULAR | Status: AC | PRN
Start: 2021-06-14 — End: 2021-06-14
  Administered 2021-06-14: 3 mL

## 2021-06-14 NOTE — Addendum Note (Signed)
Addended by: Barbette Or on: 06/14/2021 04:55 PM   Modules accepted: Orders

## 2021-06-14 NOTE — Progress Notes (Signed)
Office Visit Note   Patient: Dakota Barnes           Date of Birth: 10-09-1945           MRN: [id number] Visit Date: 06/14/2021              Requested by: Dakota Chessman, MD [address] [place],  [place] [address]+     PCP: Dakota Chessman, MD   Assessment & Plan: Visit Diagnoses:  1. Chronic pain of right knee   2. Bilateral hand numbness   3. Right shoulder pain, unspecified chronicity     Plan: He will watch his glucose levels closely over the next 24 to 48 hours.  We will have him undergo EMG nerve conduction studies upper extremities to rule out carpal tunnel as a source of his numbness in his hands.  In regards to the right shoulder May consider cortisone injection subacromial versus intra-articular at next office visit.  He shown shoulder exercises.  Questions were encouraged and answered at length  Follow-Up Instructions: Return AFTER EMG NCS.   Orders:  Orders Placed This Encounter  Procedures   Large Joint Inj   XR Knee 1-2 Views Right   No orders of the defined types were placed in this encounter.     Procedures: Large Joint Inj: R knee on 06/14/2021 4:39 PM Medications: 3 mL lidocaine 1 %; 40 mg methylPREDNISolone acetate 40 MG/ML Consent was given by the patient. Immediately prior to procedure a time out was called to verify the correct patient, procedure, equipment, support staff and site/side marked as required. Patient was prepped and draped in the usual sterile fashion.      Clinical Data: No additional findings.   Subjective: Chief Complaint  Patient presents with   Right Shoulder - Pain   Right Knee - Pain    HPI Dakota Barnes is a 75 year old male who is known to Dakota Barnes service comes in today with multiple complaints.  Complaining of right knee pain.  He was last seen in April 2019 and was given a Monovisc injection in his knee and did well until recently in regards to the knee.  Does report 2 and half  months ago he twisted and went over getting out of the camper.  He states knee gives way on him now.  States the knee feels stiff. Right shoulder pain states bothers him when driving long distances and it bothers him more at night.  He reports that he popped the tendon to be 8 and half years ago.  Did recently have radiographs of the shoulder on 05/30/2021 [facility] is able to reviewed the read on the radiographs but not the actual images.  That showed some mild glenohumeral joint arthritic changes and arthritic changes AC joint.  Otherwise shoulder well located no bony abnormalities. He also notes that he has numbness tingling both hands states his hands go numb when he is holding any objects like a phone.  Does wear night splints states he has been told that he has carpal tunnel syndrome both hands is unsure if he had nerve conduction studies though.  Is diabetic last hemoglobin A1c was 7.7. Review of Systems Negative for fevers chills.  Please see HPI  Objective: Vital Signs: There were no vitals taken for this visit.  Physical Exam Constitutional:      Appearance: He is not ill-appearing or diaphoretic.  Pulmonary:     Effort: Pulmonary  effort is normal.  Neurological:     Mental Status: He is alert and oriented to person, place, and time.  Psychiatric:        Behavior: Behavior normal.    Ortho Exam Bilateral hands sensation intact throughout.  Positive Tinel's over the median nerve at the wrist bilaterally. Bilateral shoulders 5 out of 5 strength with external and internal rotation against resistance.  Empty can test is negative bilaterally.  Impingement testing is negative right shoulder no significant crepitus.  Biceps muscle belly does not appear low riding. Bilateral knees: Good range of motion.  Right knee no abnormal warmth erythema or effusion.  No instability valgus varus stressing right knee.  Tenderness over the medial joint line right knee.  Specialty Comments:  No  specialty comments available.  Imaging: XR Knee 1-2 Views Right  Result Date: 06/14/2021 Right knee 2 views: Moderate medial lateral and patellofemoral compartmental arthritis.  No acute fractures.  Knee is well located.    PMFS History: Patient Active Problem List   Diagnosis Date Noted   Unilateral primary osteoarthritis, right knee 09/23/2017   Chronic pain of right knee 09/23/2017   Past Medical History:  Diagnosis Date   Diabetes mellitus without complication (HCC)    Hyperlipidemia    Hypertension     History reviewed. No pertinent family history.  Past Surgical History:  Procedure Laterality Date   BACK SURGERY     Social History   Occupational History   Not on file  Tobacco Use   Smoking status: Never   Smokeless tobacco: Never  Vaping Use   Vaping Use: Never used  Substance and Sexual Activity   Alcohol use: Yes    Comment: beer 1-2 on weekends and after work   Drug use: Not Currently   Sexual activity: Not on file

## 2021-06-21 ENCOUNTER — Ambulatory Visit (INDEPENDENT_AMBULATORY_CARE_PROVIDER_SITE_OTHER): Payer: Medicare HMO | Admitting: Physical Medicine and Rehabilitation

## 2021-06-21 ENCOUNTER — Encounter: Payer: Self-pay | Admitting: Physical Medicine and Rehabilitation

## 2021-06-21 ENCOUNTER — Other Ambulatory Visit: Payer: Self-pay

## 2021-06-21 DIAGNOSIS — R202 Paresthesia of skin: Secondary | ICD-10-CM | POA: Diagnosis not present

## 2021-06-21 NOTE — Progress Notes (Signed)
Pt state numbness in both hands. Pt state when sleeping or drinking coffee he notice that his hands goes numb. Pt state he has numbness in all fingers.Pt state he shakes his hands to help his the numbness. Pt state he is right handed.  Numeric Pain Rating Scale and Functional Assessment Average Pain 8   In the last MONTH (on 0-10 scale) has pain interfered with the following?  1. General activity like being  able to carry out your everyday physical activities such as walking, climbing stairs, carrying groceries, or moving a chair?  Rating(10)    -BT, -Dye Allergies.

## 2021-06-26 NOTE — Progress Notes (Signed)
Dakota Barnes - 75 y.o. male MRN [id number]  Date of birth: 16-Nov-1945  Office Visit Note: Visit Date: 06/21/2021 PCP: Angelica Chessman, MD Referred by: Angelica Chessman, MD  Subjective: Chief Complaint  Patient presents with   Left Hand - Numbness   Right Hand - Numbness   HPI:  Dakota Barnes is a 75 y.o. male who comes in today at the request of Rexene Edison, PA-C for electrodiagnostic study of the Bilateral upper extremities.  Patient is Right hand dominant.  His biggest complaint is numbness in both hands.  He reports it is equal both right and left.  He endorses whole hand numbness but somewhat more particularly the radial digits.  He does report more symptoms at night when sleeping or when holding a phone or trying to drink coffee.  He does endorse a positive flick sign.  He has had no prior electrodiagnostic studies that he can remember.  He has a type II diabetic with last hemoglobin A1c in the middle 7 range.  He denies any frank radicular symptoms.  ROS Otherwise per HPI.  Assessment & Plan: Visit Diagnoses:    ICD-10-CM   1. 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   There is no significant electrodiagnostic evidence of any other focal nerve entrapment, brachial plexopathy or cervical radiculopathy.   Recommendations: 1.  Follow-up with referring physician. 2.  Continue current management of symptoms. 3.  Suggest surgical evaluation.  Meds & Orders: No orders of the defined types were placed in this encounter.   Orders Placed This Encounter  Procedures   NCV with EMG (electromyography)    Follow-up: Return in about 2 weeks (around 07/05/2021) for Doneen Poisson, MD.   Procedures: No procedures performed  EMG & NCV  Findings: Evaluation of the left median motor and the right median motor nerves showed prolonged distal onset latency (L5.9, R6.0 ms), reduced amplitude (L2.9, R4.1 mV), and decreased conduction velocity (Elbow-Wrist, L45, R40 m/s).  The left median (across palm) sensory nerve showed no response (Palm) and prolonged distal peak latency (5.6 ms).  The right median (across palm) sensory nerve showed no response (Palm), prolonged distal peak latency (9.8 ms), and reduced amplitude (5.1 V).  The left ulnar sensory nerve showed prolonged distal peak latency (3.9 ms) and decreased conduction velocity (Wrist-5th Digit, 36 m/s).  The right ulnar sensory nerve showed prolonged distal peak latency (3.8 ms), reduced amplitude (12.0 V), and decreased conduction velocity (Wrist-5th Digit, 37 m/s).  All remaining nerves (as indicated in the following tables) were within normal limits.  All left vs. right side differences were within normal limits.    All examined muscles (as indicated in the following table) showed no evidence of electrical instability.    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   There is no significant electrodiagnostic evidence of any other focal nerve entrapment, brachial plexopathy or cervical radiculopathy.   Recommendations: 1.  Follow-up with referring physician. 2.  Continue current management of symptoms. 3.  Suggest surgical evaluation.  ___________________________ Elease Hashimoto Board Certified, American Board of Physical Medicine and Rehabilitation    Nerve Conduction Studies Anti Sensory Summary Table  Stim Site NR Peak (ms) Norm Peak (ms) P-T Amp (V) Norm P-T Amp Site1 Site2 Delta-P (ms) Dist (cm) Vel (m/s) Norm Vel (m/s)  Left Median Acr Palm Anti Sensory (2nd Digit)  31.8C  Wrist    *5.6 <3.6 10.0 >10  Wrist Palm  0.0    Palm *NR  <2.0          Right Median Acr Palm Anti Sensory (2nd Digit)  31.3C  Wrist    *9.8 <3.6 *5.1 >10 Wrist Palm  0.0    Palm *NR  <2.0          Left Radial Anti Sensory (Base 1st Digit)  31.8C  Wrist    2.3 <3.1 25.8  Wrist Base 1st Digit 2.3 0.0    Right Radial Anti Sensory (Base 1st Digit)  31.5C  Wrist    2.5 <3.1 12.1  Wrist Base 1st Digit 2.5 0.0    Left Ulnar Anti Sensory (5th Digit)  32.1C  Wrist    *3.9 <3.7 15.8 >15.0 Wrist 5th Digit 3.9 14.0 *36 >38  Right Ulnar Anti Sensory (5th Digit)  31.6C  Wrist    *3.8 <3.7 *12.0 >15.0 Wrist 5th Digit 3.8 14.0 *37 >38   Motor Summary Table   Stim Site NR Onset (ms) Norm Onset (ms) O-P Amp (mV) Norm O-P Amp Site1 Site2 Delta-0 (ms) Dist (cm) Vel (m/s) Norm Vel (m/s)  Left Median Motor (Abd Poll Brev)  32.1C  Wrist    *5.9 <4.2 *2.9 >5 Elbow Wrist 4.8 21.5 *45 >50  Elbow    10.7  2.7         Right Median Motor (Abd Poll Brev)  31.7C  Wrist    *6.0 <4.2 *4.1 >5 Elbow Wrist 5.3 21.0 *40 >50  Elbow    11.3  3.5         Left Ulnar Motor (Abd Dig Min)  32.2C  Wrist    3.5 <4.2 7.1 >3 B Elbow Wrist 4.1 22.0 54 >53  B Elbow    7.6  5.8  A Elbow B Elbow 1.5 10.0 67 >53  A Elbow    9.1  5.8         Right Ulnar Motor (Abd Dig Min)  31.8C  Wrist    3.4 <4.2 8.0 >3 B Elbow Wrist 3.9 21.0 54 >53  B Elbow    7.3  7.5  A Elbow B Elbow 1.9 10.0 53 >53  A Elbow    9.2  6.5          EMG   Side Muscle Nerve Root Ins Act Fibs Psw Amp Dur Poly Recrt Int Dennie Bible Comment  Right Abd Poll Brev Median C8-T1 Nml Nml Nml Nml Nml 0 Nml Nml   Right 1stDorInt Ulnar C8-T1 Nml Nml Nml Nml Nml 0 Nml Nml   Right PronatorTeres Median C6-7 Nml Nml Nml Nml Nml 0 Nml Nml   Right Biceps Musculocut C5-6 Nml Nml Nml Nml Nml 0 Nml Nml   Right Deltoid Axillary C5-6 Nml Nml Nml Nml Nml 0 Nml Nml     Nerve Conduction Studies Anti Sensory Left/Right Comparison   Stim Site L Lat (ms) R Lat (ms) L-R Lat (ms) L Amp (V) R Amp (V) L-R Amp (%)  Site1 Site2 L Vel (m/s) R Vel (m/s) L-R Vel (m/s)  Median Acr Palm Anti Sensory (2nd Digit)  31.8C  Wrist *5.6 *9.8 4.2 10.0 *5.1 49.0 Wrist [employer]  Radial Anti Sensory (Base 1st Digit)  31.8C  Wrist 2.3 2.5 0.2 25.8 12.1 53.1 Wrist Base 1st Digit     Ulnar Anti Sensory (5th Digit)  32.1C  Wrist *3.9 *3.8 0.1 15.8 *12.0 24.1 Wrist 5th Digit *36 *37 1   Motor Left/Right Comparison   Stim Site L Lat (ms) R Lat (ms) L-R Lat (ms) L Amp (mV) R Amp (mV) L-R Amp (%) Site1 Site2 L Vel (m/s) R Vel (m/s) L-R Vel (m/s)  Median Motor (Abd Poll Brev)  32.1C  Wrist *5.9 *6.0 0.1 *2.9 *4.1 29.3 Elbow Wrist *45 *40 5  Elbow 10.7 11.3 0.6 2.7 3.5 22.9       Ulnar Motor (Abd Dig Min)  32.2C  Wrist 3.5 3.4 0.1 7.1 8.0 11.3 B Elbow Wrist 54 54 0  B Elbow 7.6 7.3 0.3 5.8 7.5 22.7 A Elbow B Elbow 67 53 14  A Elbow 9.1 9.2 0.1 5.8 6.5 10.8          Waveforms:                     Clinical History: No specialty comments available.     Objective:  VS:  HT:    WT:   BMI:     BP:   HR: bpm  TEMP: ( )  RESP:  Physical Exam Musculoskeletal:        General: No tenderness.     Comments: Inspection reveals flattening of the bilateral APB but no atrophy of the bilateral  FDI or hand intrinsics. There is no swelling, color changes, allodynia or dystrophic changes. There is 5 out of 5 strength in the bilateral wrist extension, finger abduction and long finger flexion.  There is decreased sensation to light touch in the median nerve distribution bilaterally. There is a positive Phalen's test bilaterally. There is a negative Hoffmann's test bilaterally.  Skin:    General: Skin is warm and dry.     Findings: No erythema or rash.  Neurological:     General: No focal deficit present.     Mental Status: He is alert and oriented to person, place, and time.     Sensory: No sensory deficit.     Motor: No weakness or abnormal muscle tone.     Coordination: Coordination normal.      Gait: Gait normal.  Psychiatric:        Mood and Affect: Mood normal.        Behavior: Behavior normal.        Thought Content: Thought content normal.     Imaging: No results found.

## 2021-06-26 NOTE — Procedures (Signed)
EMG & NCV Findings: Evaluation of the left median motor and the right median motor nerves showed prolonged distal onset latency (L5.9, R6.0 ms), reduced amplitude (L2.9, R4.1 mV), and decreased conduction velocity (Elbow-Wrist, L45, R40 m/s).  The left median (across palm) sensory nerve showed no response (Palm) and prolonged distal peak latency (5.6 ms).  The right median (across palm) sensory nerve showed no response (Palm), prolonged distal peak latency (9.8 ms), and reduced amplitude (5.1 V).  The left ulnar sensory nerve showed prolonged distal peak latency (3.9 ms) and decreased conduction velocity (Wrist-5th Digit, 36 m/s).  The right ulnar sensory nerve showed prolonged distal peak latency (3.8 ms), reduced amplitude (12.0 V), and decreased conduction velocity (Wrist-5th Digit, 37 m/s).  All remaining nerves (as indicated in the following tables) were within normal limits.  All left vs. right side differences were within normal limits.    All examined muscles (as indicated in the following table) showed no evidence of electrical instability.    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   There is no significant electrodiagnostic evidence of any other focal nerve entrapment, brachial plexopathy or cervical radiculopathy.   Recommendations: 1.  Follow-up with referring physician. 2.  Continue current management of symptoms. 3.  Suggest surgical evaluation.  ___________________________ Naaman Plummer FAAPMR Board Certified, American Board of Physical Medicine and Rehabilitation    Nerve Conduction Studies Anti Sensory Summary Table   Stim Site NR Peak (ms) Norm Peak (ms) P-T Amp (V) Norm P-T Amp Site1 Site2 Delta-P (ms) Dist (cm) Vel (m/s) Norm Vel (m/s)  Left Median Acr Palm Anti Sensory (2nd Digit)  31.8C  Wrist    *5.6 <3.6  10.0 >10 Wrist Palm  0.0    Palm *NR  <2.0          Right Median Acr Palm Anti Sensory (2nd Digit)  31.3C  Wrist    *9.8 <3.6 *5.1 >10 Wrist Palm  0.0    Palm *NR  <2.0          Left Radial Anti Sensory (Base 1st Digit)  31.8C  Wrist    2.3 <3.1 25.8  Wrist Base 1st Digit 2.3 0.0    Right Radial Anti Sensory (Base 1st Digit)  31.5C  Wrist    2.5 <3.1 12.1  Wrist Base 1st Digit 2.5 0.0    Left Ulnar Anti Sensory (5th Digit)  32.1C  Wrist    *3.9 <3.7 15.8 >15.0 Wrist 5th Digit 3.9 14.0 *36 >38  Right Ulnar Anti Sensory (5th Digit)  31.6C  Wrist    *3.8 <3.7 *12.0 >15.0 Wrist 5th Digit 3.8 14.0 *37 >38   Motor Summary Table   Stim Site NR Onset (ms) Norm Onset (ms) O-P Amp (mV) Norm O-P Amp Site1 Site2 Delta-0 (ms) Dist (cm) Vel (m/s) Norm Vel (m/s)  Left Median Motor (Abd Poll Brev)  32.1C  Wrist    *5.9 <4.2 *2.9 >5 Elbow Wrist 4.8 21.5 *45 >50  Elbow    10.7  2.7         Right Median Motor (Abd Poll Brev)  31.7C  Wrist    *6.0 <4.2 *4.1 >5 Elbow Wrist 5.3 21.0 *40 >50  Elbow    11.3  3.5         Left Ulnar Motor (Abd Dig Min)  32.2C  Wrist    3.5 <4.2 7.1 >  3 B Elbow Wrist 4.1 22.0 54 >53  B Elbow    7.6  5.8  A Elbow B Elbow 1.5 10.0 67 >53  A Elbow    9.1  5.8         Right Ulnar Motor (Abd Dig Min)  31.8C  Wrist    3.4 <4.2 8.0 >3 B Elbow Wrist 3.9 21.0 54 >53  B Elbow    7.3  7.5  A Elbow B Elbow 1.9 10.0 53 >53  A Elbow    9.2  6.5          EMG   Side Muscle Nerve Root Ins Act Fibs Psw Amp Dur Poly Recrt Int Dennie Bible Comment  Right Abd Poll Brev Median C8-T1 Nml Nml Nml Nml Nml 0 Nml Nml   Right 1stDorInt Ulnar C8-T1 Nml Nml Nml Nml Nml 0 Nml Nml   Right PronatorTeres Median C6-7 Nml Nml Nml Nml Nml 0 Nml Nml   Right Biceps Musculocut C5-6 Nml Nml Nml Nml Nml 0 Nml Nml   Right Deltoid Axillary C5-6 Nml Nml Nml Nml Nml 0 Nml Nml     Nerve Conduction Studies Anti Sensory Left/Right Comparison   Stim Site L Lat (ms) R Lat (ms) L-R Lat (ms) L Amp (V) R Amp (V) L-R  Amp (%) Site1 Site2 L Vel (m/s) R Vel (m/s) L-R Vel (m/s)  Median Acr Palm Anti Sensory (2nd Digit)  31.8C  Wrist *5.6 *9.8 4.2 10.0 *5.1 49.0 Wrist Palm     Palm             Radial Anti Sensory (Base 1st Digit)  31.8C  Wrist 2.3 2.5 0.2 25.8 12.1 53.1 Wrist Base 1st Digit     Ulnar Anti Sensory (5th Digit)  32.1C  Wrist *3.9 *3.8 0.1 15.8 *12.0 24.1 Wrist 5th Digit *36 *37 1   Motor Left/Right Comparison   Stim Site L Lat (ms) R Lat (ms) L-R Lat (ms) L Amp (mV) R Amp (mV) L-R Amp (%) Site1 Site2 L Vel (m/s) R Vel (m/s) L-R Vel (m/s)  Median Motor (Abd Poll Brev)  32.1C  Wrist *5.9 *6.0 0.1 *2.9 *4.1 29.3 Elbow Wrist *45 *40 5  Elbow 10.7 11.3 0.6 2.7 3.5 22.9       Ulnar Motor (Abd Dig Min)  32.2C  Wrist 3.5 3.4 0.1 7.1 8.0 11.3 B Elbow Wrist 54 54 0  B Elbow 7.6 7.3 0.3 5.8 7.5 22.7 A Elbow B Elbow 67 53 14  A Elbow 9.1 9.2 0.1 5.8 6.5 10.8          Waveforms:

## 2021-07-19 ENCOUNTER — Encounter: Payer: Self-pay | Admitting: Orthopaedic Surgery

## 2021-07-19 ENCOUNTER — Ambulatory Visit: Payer: Medicare HMO | Admitting: Orthopaedic Surgery

## 2021-07-19 DIAGNOSIS — G5602 Carpal tunnel syndrome, left upper limb: Secondary | ICD-10-CM

## 2021-07-19 DIAGNOSIS — G5601 Carpal tunnel syndrome, right upper limb: Secondary | ICD-10-CM | POA: Diagnosis not present

## 2021-07-19 NOTE — Progress Notes (Signed)
The patient comes in today to go over nerve conduction studies of his bilateral upper extremities due to significant hand numbness and tingling bilaterally.  He is right-hand dominant.  He is a diabetic and has been working on better blood glucose control.  On exam he does have weak pinch and grip strength bilaterally.  He has numbness in the median nerve distribution bilaterally.  There is no muscle atrophy in his hands but there is a positive Phalen's and Tinel's exam.  Nerve conduction studies confirm severe bilateral entrapment of the median nerve at the transverse carpal ligament area of the wrist.  I did explain with severe carpal tunnel syndrome means.  We talked about surgical interventions and how this may not take care of all the numbness given the severity of his carpal tunnel syndrome combined with his diabetes.  I do think that it will help him enough to feel better overall and he agrees.  I explained in detail what the surgery involves.  We talked about the interoperative and postoperative course and what to expect.  He is interested in having this set up for his left hand first.  They hurt and are numb equally the same.  I feel that starting with the nondominant hand first is appropriate.  We will work on setting him up for surgery and will be in touch.  Follow-up will be in 2 weeks after surgery.  All questions and concerns were answered and addressed.

## 2021-08-17 ENCOUNTER — Other Ambulatory Visit: Payer: Self-pay | Admitting: Orthopaedic Surgery

## 2021-08-17 DIAGNOSIS — G5602 Carpal tunnel syndrome, left upper limb: Secondary | ICD-10-CM

## 2021-08-17 MED ORDER — HYDROCODONE-ACETAMINOPHEN 5-325 MG PO TABS: 1.0000 | ORAL_TABLET | Freq: Four times a day (QID) | ORAL | 0 refills | Status: DC | PRN

## 2021-08-31 ENCOUNTER — Other Ambulatory Visit: Payer: Self-pay

## 2021-08-31 ENCOUNTER — Ambulatory Visit (INDEPENDENT_AMBULATORY_CARE_PROVIDER_SITE_OTHER): Payer: Medicare HMO | Admitting: Orthopaedic Surgery

## 2021-08-31 ENCOUNTER — Encounter: Payer: Self-pay | Admitting: Orthopaedic Surgery

## 2021-08-31 DIAGNOSIS — Z9889 Other specified postprocedural states: Secondary | ICD-10-CM

## 2021-08-31 NOTE — Progress Notes (Signed)
The patient comes in today 2 weeks status post a left open carpal tunnel release.  He is 76 years old and says he is now asymptomatic and is doing great.  He has known severe carpal tunnel on the right side.  The sutures been removed and I placed Steri-Strips over his left palm incision.  It looks great overall.  He is able to move his fingers and thumb and reports normal sensation.  He does have weak grip and pinch strength.  He is hoping to proceed with a right open carpal tunnel release in late March.  I would like to see him back in 3 weeks to see how he is doing overall from his left hand surgery and see about setting up after then for a right open carpal tunnel release to treat his severe right carpal tunnel syndrome.

## 2021-09-20 ENCOUNTER — Ambulatory Visit (INDEPENDENT_AMBULATORY_CARE_PROVIDER_SITE_OTHER): Payer: Medicare HMO | Admitting: Orthopaedic Surgery

## 2021-09-20 ENCOUNTER — Encounter: Payer: Self-pay | Admitting: Orthopaedic Surgery

## 2021-09-20 DIAGNOSIS — Z9889 Other specified postprocedural states: Secondary | ICD-10-CM

## 2021-09-20 DIAGNOSIS — G5601 Carpal tunnel syndrome, right upper limb: Secondary | ICD-10-CM

## 2021-09-20 NOTE — Progress Notes (Signed)
The patient is a 76 year old gentleman who is now 5 weeks status post a left open carpal tunnel release.  This was to treat severe carpal tunnel syndrome.  He says the numbness and tingling is gone but he still hurts at the incision and burns on occasion.  He does wear carpal tunnel brace.  He says is more problematic when he is doing a lot of heavy work.  He does have known severe carpal tunnel syndrome on the right side.  He wants to consider surgery on that side potentially in the fall. ? ?He has full range of motion of his fingers and thumb on the left operative side.  He has good grip and pinch strength.  His incision is healing nicely.  There is no evidence of muscle atrophy. ? ?This point we will see him in 6 months to see how he is doing overall and to see if he wants to schedule for a right open carpal tunnel release.  All questions and concerns were answered and addressed. ?

## 2021-10-05 ENCOUNTER — Telehealth: Payer: Self-pay

## 2021-10-05 NOTE — Telephone Encounter (Signed)
Pt hand is giving him issues and would like to move up surg to soon than September around end of April if possible. The best call back number is [phone].  ?

## 2021-10-24 NOTE — Telephone Encounter (Signed)
I called and left voice mail for return call to discuss. ?

## 2021-11-16 ENCOUNTER — Other Ambulatory Visit: Payer: Self-pay | Admitting: Orthopaedic Surgery

## 2021-11-16 DIAGNOSIS — G5601 Carpal tunnel syndrome, right upper limb: Secondary | ICD-10-CM

## 2021-11-16 MED ORDER — HYDROCODONE-ACETAMINOPHEN 7.5-325 MG PO TABS: 1.0000 | ORAL_TABLET | Freq: Three times a day (TID) | ORAL | 0 refills | Status: AC | PRN

## 2021-11-30 ENCOUNTER — Encounter: Payer: Self-pay | Admitting: Orthopaedic Surgery

## 2021-11-30 ENCOUNTER — Ambulatory Visit (INDEPENDENT_AMBULATORY_CARE_PROVIDER_SITE_OTHER): Payer: Medicare HMO | Admitting: Orthopaedic Surgery

## 2021-11-30 DIAGNOSIS — Z9889 Other specified postprocedural states: Secondary | ICD-10-CM

## 2021-11-30 NOTE — Progress Notes (Signed)
The patient is 2 weeks out from a right open carpal tunnel release.  We released his left carpal tunnel back in February.  He is 75 years old.  He said he is doing better overall.  He still reports some residual numbness and pain but says his function is improved.  His left hand incision is healed completely.  He has just a little bit of pain to be expected at the incision itself.  He has good function of the left hand.  His more recent right hand has the sutures removed and I did place some Steri-Strips.  He does have still some numbness to be expected but it should be well overall and doing well overall.  He understands that as well and he says he can follow-up as needed unless there is any issues.

## 2022-04-09 ENCOUNTER — Ambulatory Visit: Payer: Medicare HMO | Admitting: Orthopaedic Surgery

## 2024-02-26 ENCOUNTER — Ambulatory Visit: Admitting: Family Medicine
